# Patient Record
Sex: Male | Born: 1995 | Hispanic: No | Marital: Married | Smoking: Never smoker
Health system: Southern US, Community
[De-identification: ages and names within clinical notes are randomized; demographics above are authoritative.]

## PROBLEM LIST (undated history)

## (undated) HISTORY — PX: HERNIA REPAIR: SHX51

## (undated) HISTORY — PX: APPENDECTOMY: SHX54

---
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
## 2016-01-08 ENCOUNTER — Encounter (HOSPITAL_COMMUNITY): Payer: Self-pay | Admitting: *Deleted

## 2016-01-08 ENCOUNTER — Emergency Department (HOSPITAL_COMMUNITY)
Admission: EM | Admit: 2016-01-08 | Discharge: 2016-01-08 | Disposition: A | Discharge status: Home / Self Care | Payer: Medicaid Other | Attending: Emergency Medicine | Admitting: Emergency Medicine

## 2016-01-08 DIAGNOSIS — Z79899 Other long term (current) drug therapy: Secondary | ICD-10-CM | POA: Insufficient documentation

## 2016-01-08 DIAGNOSIS — Z4802 Encounter for removal of sutures: Secondary | ICD-10-CM

## 2016-01-08 NOTE — Discharge Instructions (Signed)
Your scar is healing well. For the next year, you should protect that area from sun exposure, either with your hair or with a hat/sunscreen if your hair is shorter. Follow up with your regular doctor as needed for any future concerns regarding the scar. Return to the ER for changes or worsening issues/symptoms.   Remocin de los materiales de cierre de la herida (Wound Closure Removal) Es Hydrographic surveyornecesario remover las grapas, los puntos o los Chesapeake Energyadhesivos cutneos que le colocaron para cerrar la piel. Deber continuar con los cuidados descritos aqu hasta que la herida haya cicatrizado por completo y el mdico le confirme que estos pueden cesar. CMO DEBO CUIDARME LA HERIDA? El cuidado de la herida despus de la remocin del cierre depende de cul era el tipo de cierre que tena. Puntos o grapas  Mantenga la herida seca y limpia. No moje la herida.  Si le aplicaron tiras ZOXWRUEAVadhesivas despus de remover las grapas, comenzarn a despegarse en The Mutual of Omahaunos das. Djelas colocadas hasta que se despeguen del todo.  Debe cambiar los apsitos (vendajes) al menos una vez al da o como se lo haya indicado el mdico. Si el vendaje esta adherido, aplique agua estril tibia sobre el vendaje hasta que se afloje y pueda retirarlo sin separar los bordes de la herida. Seque bien la zona con una toalla limpia y Honeygosuave, dando golpecitos. No frote la herida, ya que Transport plannerpuede sangrar.  Aplique una crema o un ungento que impidan la proliferacin de bacterias (crema o ungento antibacterial) solamente si el mdico se lo indic.  Coloque un vendaje no pegajoso sobre la herida para evitar que se pegue el apsito.  Cubra el vendaje no pegajoso con un nuevo apsito segn las indicaciones del mdico.  Si el vendaje se moja, se ensucia o tiene mal olor, cmbielo tan pronto como pueda.  Tome los medicamentos solamente como se lo haya indicado el mdico. Sondra Bargesdhesivos o tiras Port Washington Northadhesivas  Los Milanadhesivos y las tiras Jenny Reichmannadhesivas se despegan  solos.  Deje puestos los Artasadhesivos y las tiras [address]adhes[address] hasta que se despeguen. HAY RESTRICCIONES RESPECTO DEL BAO UNA VEZ RETIRADO EL CIERRE DE LA HERIDA? No tome baos de inmersin, no nade ni use el jacuzzi hasta que el mdico lo autorice. DE QU MANERA PUEDO REDUCIR EL TAMAO DE LA CICATRIZ? La cicatrizacin y Biomedical scientistel tamao de la herida dependen de muchos factores, como la Powder Springsedad, el tipo de cicatriz que tenga, as como de los factores genticos. Las siguientes recomendaciones pueden ayudar a Water engineerreducir el tamao de la cicatriz:  Civil engineer, contractingantalla solar. Cuando est al Guadalupe Dawnaire libre, use una pantalla solar con un factor de proteccin solar (FPS) de15, como mnimo. Vuelva a aplicarlo cada dos horas.  Masajes. Una vez que la herida est totalmente cicatrizada, puede masajear suavemente la zona de la Training and development officercicatriz. Esto puede reducir el grosor de la Training and development officercicatriz. CUNDO DEBO SOLICITAR AYUDA?  Solicite ayuda si:  Tiene fiebre.  Tiene escalofros.  Hay secrecin, enrojecimiento, hinchazn o dolor en la herida.  Advierte un olor ftido que proviene de la herida.  Los bordes de la herida se abren o no se Engineer, materialsmantienen cerrados luego de que le hayan retirado el cierre.   Esta informacin no tiene Theme park managercomo fin reemplazar el consejo del mdico. Asegrese de hacerle al mdico cualquier pregunta que tenga.   Document Released: 10/14/2008 Document Revised: 08/08/2014 Elsevier Interactive Patient Education 2016 Arvin[employer]Elsevier Inc.  Remocin de la sutura, cuidados posteriores (Suture Removal, Care After) Siga estas instrucciones durante las prximas semanas. Estas indicaciones le proporcionan  informacin general acerca de cmo deber cuidarse despus del procedimiento. El mdico tambin podr darle instrucciones ms especficas. El tratamiento se ha planificado de acuerdo a las prcticas mdicas actuales, pero a veces se producen problemas. Comunquese con el mdico si tiene algn problema o tiene dudas despus del  procedimiento. QU ESPERAR DESPUS DEL PROCEDIMIENTO Despus de que le retiren los puntos (suturas), es normal experimentar lo siguiente:  Molestias e hinchazn en la zona de la herida.  Leve enrojecimiento en la zona de la herida. INSTRUCCIONES PARA EL CUIDADO EN EL HOGAR   Si tiene bandas adhesivas en la piel sobre la zona de la herida, no las retire. Se caern solas en unos [employer]. Si las bandas Agilent Technologies siguen en su lugar despus de [address] entonces puede retirarlas.  Cambie los apsitos (vendajes), al menos, una vez al da o segn las instrucciones del mdico. Si el vendaje se adhiere, remjelo con agua jabonosa tibia.  Solo aplique un ungento o crema segn las indicaciones del mdico. Si [place] crema o ungento, lave la zona con agua y [address] veces por da para quitarlo todo. Enjuague el jabn y seque suavemente la zona con una toalla limpia.  Mantenga la herida limpia y seca. Si el vendaje se moja, se ensucia o tiene mal olor, cmbielo tan pronto como pueda.  Contine protegiendo la herida de lesiones.  Use protector solar cuando salga al sol. Las cicatrices nuevas se broncean fcilmente. SOLICITE ATENCIN MDICA SI:  Aumenta el enrojecimiento, la hinchazn o el dolor en la zona afectada.  Observa que sale pus de la herida.  Tiene fiebre.  Advierte un olor ftido que proviene de la herida o del vendaje.  La herida se abre (los bordes se separan).   Esta informacin no tiene Theme park manager el consejo del mdico. Asegrese de hacerle al mdico cualquier pregunta que tenga.   Document Released: 04/27/2005 Document Revised: 05/08/2013 Elsevier Interactive Patient Education [employer] Inc.

## 2016-01-08 NOTE — ED Notes (Signed)
Pt here for staple removal from top right scalp. Pt states the staple has been there for 4 weeks. Pt denies pain, signs of infection to site.

## 2016-01-08 NOTE — ED Provider Notes (Signed)
CSN: [id number]     Arrival date & time 01/08/16  1929 History  By signing my name below, I, Margaux Venter and Myles Gip, attest that this documentation has been prepared under the direction and in the presence of [address], [employer]. Electronically Signed: Tanda Rockers, ED Scribe. 01/08/2016. 8:00 PM.    Chief Complaint  Patient presents with  . Suture / Staple Removal    Patient is a 20 y.o. male presenting with suture removal. The history is provided by the patient and medical records. No language interpreter was used.  Suture / Staple Removal This is a new problem. The current episode started more than 1 week ago (4 weeks ago). The problem occurs rarely. The problem has been resolved. Pertinent negatives include no chest pain, no abdominal pain and no shortness of breath. Nothing aggravates the symptoms. Nothing relieves the symptoms. He has tried nothing for the symptoms. The treatment provided no relief.    HPI Comments: Marco Porter is a 20 y.o. otherwise healthy male who presents to the Emergency Department for staple removal from right parietal scalp. Pt was seen in the ED on 12/10/2015 (approximately 4 weeks ago) for laceration to right parietal. He had 1 staple placed at that time and was told to return for staple removal in 7 days. Pt states that the wound has been healing well but that he didn't come in on time. Denies any issues with the wound. Denies drainage, swelling, warmth to wound, fevers, chills, CP, SOB, abd pain, N/V/D/C, hematuria, dysuria, myalgias, arthralgias, numbness, tingling, weakness, or rashes. No additional symptoms reported.   History reviewed. No pertinent past medical history. Past Surgical History  Procedure Laterality Date  . Appendectomy     No family history on file. Social History  Substance Use Topics  . Smoking status: Never Smoker   . Smokeless tobacco: None  . Alcohol Use: Yes     Comment: occasional    Review of Systems   Constitutional: Negative for fever and chills.  HENT: Negative for facial swelling (no scalp swelling).   Respiratory: Negative for shortness of breath.   Cardiovascular: Negative for chest pain.  Gastrointestinal: Negative for nausea, vomiting, abdominal pain, diarrhea and constipation.  Genitourinary: Negative for dysuria and hematuria.  Musculoskeletal: Negative for myalgias and arthralgias.  Skin: Positive for wound (staple in scalp, no signs of infection). Negative for color change.  Allergic/Immunologic: Negative for immunocompromised state.  Neurological: Negative for weakness and numbness.  Psychiatric/Behavioral: Negative for confusion.  A complete 10 system review of systems was obtained and all systems are negative except as noted in the HPI and PMH.     Allergies  Review of patient's allergies indicates no known allergies.  Home Medications   Prior to Admission medications   Medication Sig Start Date End Date Taking? Authorizing Provider  amoxicillin (AMOXIL) 500 MG capsule Take 1 capsule (500 mg total) by mouth 3 (three) times daily. 06/30/14   Tiffany Neva Seat, PA-C  ibuprofen (ADVIL,MOTRIN) 600 MG tablet Take 1 tablet (600 mg total) by mouth every 6 (six) hours as needed. 12/10/15   Ace Gins Sam, PA-C   BP 140/78 mmHg  Pulse 79  Temp(Src) 97.8 F (36.6 C) (Oral)  Resp 18  SpO2 98% Physical Exam  Constitutional: He is oriented to person, place, and time. Vital signs are normal. He appears well-developed and well-nourished.  Non-toxic appearance. No distress.  Afebrile, nontoxic, NAD  HENT:  Head: Normocephalic and atraumatic.  Mouth/Throat: Mucous membranes are normal.  Eyes: Conjunctivae and EOM are normal. Right eye exhibits no discharge. Left eye exhibits no discharge.  Neck: Normal range of motion. Neck supple.  Cardiovascular: Normal rate.   Pulmonary/Chest: Effort normal. No respiratory distress.  Abdominal: Normal appearance. He exhibits no distension.   Musculoskeletal: Normal range of motion.  Neurological: He is alert and oriented to person, place, and time. He has normal strength. No sensory deficit.  Skin: Skin is warm, dry and intact. No rash noted.  Right parietal scalp with well healed scar and intact staple, no surrounding erythema or warmth, no drainage, or signs of infection.   Psychiatric: He has a normal mood and affect.  Nursing note and vitals reviewed.   ED Course  .Suture Removal Date/Time: 01/08/2016 8:00 PM Performed by: Marjean DonnaAMPRUBI-SOMS, Janes Colegrove Authorized by: Allen DerryAMPRUBI-SOMS, Reis Goga Consent: Verbal consent obtained. Risks and benefits: risks, benefits and alternatives were discussed Consent given by: patient Patient understanding: patient states understanding of the procedure being performed Patient consent: the patient's understanding of the procedure matches consent given Patient identity confirmed: verbally with patient Body area: head/neck Location details: scalp Wound Appearance: clean Staples Removed: 1 Facility: sutures placed in this facility Patient tolerance: Patient tolerated the procedure well with no immediate complications    DIAGNOSTIC STUDIES: Oxygen Saturation is 98% on RA, Normal by my interpretation.    COORDINATION OF CARE: 7:46 PM-Discussed treatment plan which includes staple removal with pt at bedside and pt agreed to plan.     MDM   Final diagnoses:  Encounter for staple removal    20 y.o. male here for staple removal, 1 staple placed on R parietal scalp 4wks ago, remarkably the scar is well healed and the staple is easily seen and not embedded. No drainage or evidence of infection. Staple removed without difficulty. Discussed scar care. F/up with PCP as needed for future concerns regarding scar. I explained the diagnosis and have given explicit precautions to return to the ER including for any other new or worsening symptoms. The patient understands and accepts the medical plan as  it's been dictated and I have answered their questions. Discharge instructions concerning home care and prescriptions have been given. The patient is STABLE and is discharged to home in good condition.   I personally performed the services described in this documentation, which was scribed in my presence. The recorded information has been reviewed and is accurate.  BP 140/78 mmHg  Pulse 79  Temp(Src) 97.8 F (36.6 C) (Oral)  Resp 18  SpO2 98%  No orders of the defined types were placed in this encounter.     France RavensMercedes Camprubi-Soms, PA-C 01/08/16 2003  Bethann BerkshireJoseph Zammit, MD 01/08/16 [phone]

## 2020-04-30 HISTORY — PX: OTHER SURGICAL HISTORY: SHX169

## 2020-06-11 ENCOUNTER — Ambulatory Visit: Payer: Self-pay | Attending: Orthopedic Surgery | Admitting: Physical Therapy

## 2020-06-11 ENCOUNTER — Encounter: Payer: Self-pay | Admitting: Physical Therapy

## 2020-06-11 ENCOUNTER — Other Ambulatory Visit: Payer: Self-pay

## 2020-06-11 DIAGNOSIS — M6281 Muscle weakness (generalized): Secondary | ICD-10-CM | POA: Insufficient documentation

## 2020-06-11 DIAGNOSIS — R262 Difficulty in walking, not elsewhere classified: Secondary | ICD-10-CM | POA: Insufficient documentation

## 2020-06-11 DIAGNOSIS — M79604 Pain in right leg: Secondary | ICD-10-CM | POA: Insufficient documentation
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
## 2020-06-11 NOTE — Patient Instructions (Signed)
Access Code: [email or web address] URL: [email or web address] Date: 06/11/2020 Prepared by: Dwana Curd  Exercises Seated Long Arc Quad - 1 x daily - 7 x weekly - 3 sets - 10 reps Supine Quad Set - 1 x daily - 7 x weekly - 3 sets - 10 reps Beginner Reverse Clamshell - 1 x daily - 7 x weekly - 3 sets - 10 reps Clam - 1 x daily - 7 x weekly - 3 sets - 10 reps Seated Hip Abduction with Resistance - 1 x daily - 7 x weekly - 3 sets - 10 reps Heel rises with counter support - 1 x daily - 7 x weekly - 3 sets - 10 reps Single Leg Stance - 1 x daily - 7 x weekly - 3 sets - 10 reps

## 2020-06-16 ENCOUNTER — Ambulatory Visit: Payer: Self-pay | Admitting: Physical Therapy

## 2020-06-16 ENCOUNTER — Other Ambulatory Visit: Payer: Self-pay

## 2020-06-16 DIAGNOSIS — R262 Difficulty in walking, not elsewhere classified: Secondary | ICD-10-CM

## 2020-06-16 DIAGNOSIS — M6281 Muscle weakness (generalized): Secondary | ICD-10-CM

## 2020-06-16 DIAGNOSIS — M79604 Pain in right leg: Secondary | ICD-10-CM

## 2020-06-16 NOTE — Patient Instructions (Signed)
Access Code: [email or web address] URL: [email or web address] Date: 06/16/2020 Prepared by: Lavinia Sharps  Exercises Seated Long Arc Quad - 1 x daily - 7 x weekly - 3 sets - 10 reps Supine Quad Set - 1 x daily - 7 x weekly - 3 sets - 10 reps Beginner Reverse Clamshell - 1 x daily - 7 x weekly - 3 sets - 10 reps Clam - 1 x daily - 7 x weekly - 3 sets - 10 reps Seated Hip Abduction with Resistance - 1 x daily - 7 x weekly - 3 sets - 10 reps Heel rises with counter support - 1 x daily - 7 x weekly - 3 sets - 10 reps Single Leg Stance - 1 x daily - 7 x weekly - 3 sets - 10 reps Standing Knee Flexion Stretch on Step - 1 x daily - 7 x weekly - 1 sets - 10 reps Standing Knee Flexion with Counter Support - 1 x daily - 7 x weekly - 1 sets - 10 reps Mini Squat with Counter Support - 1 x daily - 7 x weekly - 1 sets - 10 reps Retro Step - 1 x daily - 7 x weekly - 1 sets - 10 reps Seated Knee Flexion Slide - 1 x daily - 7 x weekly - 1 sets - 10 reps

## 2020-06-16 NOTE — Therapy (Addendum)
[facility] [address] [place], [place], [address] Phone: [phone]   Fax:  [phone]  Physical Therapy Treatment  Patient Details  Name: Marco Porter MRN: [id number] Date of Birth: [birth year] Referring Provider (PT): Ainsley Spinner, [place]   Encounter Date: 06/16/2020   PT End of Session - 06/16/20 1050    Visit Number 2    Date for PT Re-Evaluation 08/06/20    Authorization Type self pay    PT Start Time 0802    PT Stop Time 7591    PT Time Calculation (min) 42 min    Activity Tolerance Patient tolerated treatment well           No past medical history on file.  Past Surgical History:  Procedure Laterality Date  . APPENDECTOMY    . HERNIA REPAIR    . Rt femur fx  Right 04/30/2020    There were no vitals filed for this visit.   Subjective Assessment - 06/16/20 0804    Subjective Exercises are going good but leg raises are challenging.  Stiff in the mornings.  Discomfort at night time mostly.  Reports he continues to use his cane full time around the house too.    Currently in Pain? Yes    Pain Score 2     Pain Location Knee    Pain Orientation Right    Pain Radiating Towards anterior hip as well                             [facility] Adult PT Treatment/Exercise - 06/16/20 0001      Ambulation/Gait   Gait Comments Encouraged heel strike and knee flexion during during gait cycle       Knee/Hip Exercises: Stretches   Active Hamstring Stretch Right;5 reps    Active Hamstring Stretch Limitations 2nd step     Quad Stretch Right    Quad Stretch Limitations 1st step 1 min       Knee/Hip Exercises: Aerobic   Recumbent Bike 2 min full revolutions     Nustep 5 min L 1 seat 7      Knee/Hip Exercises: Standing   Heel Raises Both;10 reps    Hip Flexion Stengthening;Right;10 reps    Hip Flexion Limitations light green loop     Hip Abduction Stengthening;Right;10 reps    Abduction  Limitations light green loop     SLS with Vectors knee flexion 2# 10x     Other Standing Knee Exercises side to side weight shift 1 min     Other Standing Knee Exercises retro step 1 min      Knee/Hip Exercises: Seated   Long Arc Quad Strengthening;Right;10 reps    Long Arc Quad Weight 2 lbs.    Long Arc Quad Limitations 90-30 degrees     Other Seated Knee/Hip Exercises floor slider for flexion 3 min                   PT Education - 06/16/20 1049    Education Details floor slider for knee flexion; retro stepping; mini squat; HS curls; knee ROM on step    Person(s) Educated Patient    Methods Explanation;Demonstration;Handout    Comprehension Returned demonstration;Verbalized understanding               PT Long Term Goals - 06/11/20 1626      PT LONG TERM GOAL #1   Title ind  with HEP to work on return to normal gym and running routine    Time 8    Period Weeks    Status New    Target Date 08/06/20      PT LONG TERM GOAL #2   Title pt will be able to ambulate without Trendelenburg at least 5 minutes with LRAD    Time 8    Period Weeks    Status New    Target Date 08/06/20      PT LONG TERM GOAL #3   Title Pt will be able to perform SLS on Rt LE without trendelenburg or UE support x 10 seconds    Time 8    Period Weeks    Status New    Target Date 08/06/20      PT LONG TERM GOAL #4   Title Pt will be 5/5 MMT throughout Rt LE for improved gait and abiltiy to perform squats    Time 8    Period Weeks    Status New    Target Date 08/06/20      PT LONG TERM GOAL #5   Title 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  Proximal hip weakness as well.  Knee flexion ROM improves through the duration of treatment session from a stiff knee gait on arrival to a more fluid pattern.  His pain level remains low at a 2-3/10 level.  He demonstrates good carryover of initial HEP although he reports some of the ex's are very challenging.  Therapist monitoring response throughout treatment session.    Examination-Participation Restrictions Community Activity;Yard Work;Occupation    Stability/Clinical Decision Making Stable/Uncomplicated    Rehab Potential Excellent    PT Frequency 1x / week    PT Treatment/Interventions ADLs/Self Care Home Management;Biofeedback;Cryotherapy;Electrical Stimulation;Moist Heat;Therapeutic activities;Therapeutic exercise;Neuromuscular re-education;Patient/family education;Manual techniques;Dry needling;Passive range of motion;Taping;Gait training;Vasopneumatic Device    PT Next Visit Plan progressive right LE strengthening (WBAT) appropriate for approx 6 1/2 weeks s/p femur surgical fixation (prox and distal)    PT Home Exercise Plan Access Code: [email or web address]           Patient will benefit from skilled therapeutic intervention in order to improve the following deficits and impairments:  Abnormal gait, Increased fascial restricitons, Pain, Decreased strength, Increased edema, Impaired flexibility, Decreased endurance, Difficulty walking  Visit Diagnosis: Muscle weakness (generalized)  Pain in right leg  Difficulty in walking, not elsewhere classified  PHYSICAL THERAPY DISCHARGE SUMMARY  Visits from Start of Care: 2  Current functional level related to goals / functional outcomes: The patient no showed for last appointments and did not return phone calls.  Will discharge from PT at this time.    Remaining deficits: As above   Education / Equipment: Basic HEP Plan:  Patient goals were not met. Patient is being discharged due to not returning since the last visit.  ?????          Problem List There are no problems to display for this patient.  Ruben Im, PT 06/16/20 2:10 PM Phone: [phone] Fax: [phone]  Alvera Singh 06/16/2020, Howie Ill PM  [facility] [address] [place], [place], [address] Phone: [phone]   Fax:  [phone]  Name: Marco Porter MRN: [id number] Date of Birth: 01/01/1996

## 2020-07-08 ENCOUNTER — Ambulatory Visit: Payer: Self-pay | Attending: Orthopedic Surgery | Admitting: Physical Therapy

## 2020-07-15 ENCOUNTER — Ambulatory Visit: Payer: Self-pay | Admitting: Physical Therapy

## 2020-07-21 ENCOUNTER — Encounter: Payer: Self-pay | Admitting: Physical Therapy

## 2020-07-23 ENCOUNTER — Encounter: Payer: Self-pay | Admitting: Physical Therapy

## 2020-07-28 ENCOUNTER — Encounter: Payer: Self-pay | Admitting: Physical Therapy

## 2020-07-30 ENCOUNTER — Encounter: Payer: Self-pay | Admitting: Physical Therapy

## 2020-08-04 ENCOUNTER — Encounter: Payer: Self-pay | Admitting: Physical Therapy

## 2020-08-06 ENCOUNTER — Encounter: Payer: Self-pay | Admitting: Physical Therapy
